# Patient Record
Sex: Female | Born: 1955 | Race: Black or African American | Hispanic: No | Marital: Single
Health system: Southern US, Community
[De-identification: ages and names within clinical notes are randomized; demographics above are authoritative.]

---

## 2010-12-09 ENCOUNTER — Inpatient Hospital Stay: Payer: Self-pay | Admitting: *Deleted

## 2011-01-18 ENCOUNTER — Emergency Department: Payer: Self-pay | Admitting: Emergency Medicine

## 2011-02-14 ENCOUNTER — Emergency Department: Payer: Self-pay | Admitting: Emergency Medicine

## 2012-06-21 ENCOUNTER — Emergency Department: Payer: Self-pay | Admitting: Emergency Medicine

## 2012-08-05 ENCOUNTER — Ambulatory Visit: Payer: Self-pay | Admitting: Emergency Medicine

## 2012-08-05 LAB — CBC WITH DIFFERENTIAL/PLATELET
Basophil #: 0 10*3/uL (ref 0.0–0.1)
Basophil %: 0.4 %
Eosinophil %: 0.8 %
Lymphocyte %: 26.7 %
MCHC: 33.6 g/dL (ref 32.0–36.0)
Monocyte #: 0.7 x10 3/mm (ref 0.2–0.9)
Monocyte %: 7 %
Neutrophil #: 6.3 10*3/uL (ref 1.4–6.5)
Neutrophil %: 65.1 %
Platelet: 194 10*3/uL (ref 150–440)
RBC: 4.43 10*6/uL (ref 3.80–5.20)
RDW: 14.2 % (ref 11.5–14.5)
WBC: 9.7 10*3/uL (ref 3.6–11.0)

## 2012-08-05 LAB — BASIC METABOLIC PANEL
Anion Gap: 9 (ref 7–16)
BUN: 16 mg/dL (ref 7–18)
Calcium, Total: 10.5 mg/dL — ABNORMAL HIGH (ref 8.5–10.1)
Chloride: 103 mmol/L (ref 98–107)
Co2: 25 mmol/L (ref 21–32)
Creatinine: 0.95 mg/dL (ref 0.60–1.30)
EGFR (African American): >60
EGFR (Non-African Amer.): >60
Glucose: 83 mg/dL (ref 65–99)
Osmolality: 274 (ref 275–301)
Potassium: 3.2 mmol/L — ABNORMAL LOW (ref 3.5–5.1)
Sodium: 137 mmol/L (ref 136–145)

## 2012-08-07 ENCOUNTER — Ambulatory Visit: Payer: Self-pay | Admitting: Emergency Medicine

## 2013-02-03 ENCOUNTER — Emergency Department: Payer: Self-pay | Admitting: Internal Medicine

## 2013-02-03 LAB — COMPREHENSIVE METABOLIC PANEL
Albumin: 3.2 g/dL — ABNORMAL LOW (ref 3.4–5.0)
Alkaline Phosphatase: 86 U/L (ref 50–136)
Chloride: 101 mmol/L (ref 98–107)
Co2: 25 mmol/L (ref 21–32)
Creatinine: 1.56 mg/dL — ABNORMAL HIGH (ref 0.60–1.30)
EGFR (African American): 42 — ABNORMAL LOW
EGFR (Non-African Amer.): 36 — ABNORMAL LOW
Osmolality: 273 (ref 275–301)
Potassium: 3.3 mmol/L — ABNORMAL LOW (ref 3.5–5.1)
SGPT (ALT): 25 U/L (ref 12–78)

## 2013-02-03 LAB — CBC
MCV: 91 fL (ref 80–100)
Platelet: 236 10*3/uL (ref 150–440)
RBC: 4.48 10*6/uL (ref 3.80–5.20)

## 2013-02-10 LAB — WOUND CULTURE

## 2013-03-15 ENCOUNTER — Emergency Department: Payer: Self-pay | Admitting: Emergency Medicine

## 2013-03-15 LAB — LIPASE, BLOOD: Lipase: 143 U/L (ref 73–393)

## 2013-03-15 LAB — TROPONIN I: Troponin-I: <0.02 ng/mL

## 2013-03-15 LAB — CBC WITH DIFFERENTIAL/PLATELET
Basophil #: 0.1 10*3/uL (ref 0.0–0.1)
HCT: 38.6 % (ref 35.0–47.0)
HGB: 13.3 g/dL (ref 12.0–16.0)
Lymphocyte #: 1.5 10*3/uL (ref 1.0–3.6)
MCH: 31 pg (ref 26.0–34.0)
Monocyte #: 0.4 x10 3/mm (ref 0.2–0.9)
Monocyte %: 3.5 %
Neutrophil #: 9.4 10*3/uL — ABNORMAL HIGH (ref 1.4–6.5)
Platelet: 269 10*3/uL (ref 150–440)
RDW: 15.9 % — ABNORMAL HIGH (ref 11.5–14.5)
WBC: 12 10*3/uL — ABNORMAL HIGH (ref 3.6–11.0)

## 2013-03-15 LAB — COMPREHENSIVE METABOLIC PANEL
Albumin: 3.5 g/dL (ref 3.4–5.0)
Alkaline Phosphatase: 91 U/L (ref 50–136)
Anion Gap: 8 (ref 7–16)
Bilirubin,Total: 1 mg/dL (ref 0.2–1.0)
Calcium, Total: 10.7 mg/dL — ABNORMAL HIGH (ref 8.5–10.1)
Creatinine: 0.9 mg/dL (ref 0.60–1.30)
EGFR (African American): >60
EGFR (Non-African Amer.): >60
Osmolality: 275 (ref 275–301)
Potassium: 3 mmol/L — ABNORMAL LOW (ref 3.5–5.1)
Sodium: 135 mmol/L — ABNORMAL LOW (ref 136–145)

## 2013-03-15 LAB — CK TOTAL AND CKMB (NOT AT ARMC): CK, Total: 59 U/L (ref 21–215)

## 2013-03-20 LAB — CULTURE, BLOOD (SINGLE)

## 2013-05-07 DEATH — deceased

## 2014-08-27 NOTE — Op Note (Signed)
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   PROCEDURE: Wide excision of this chronically infected wound, and frozen section. I was able to do a primary closure with flap closure.   DESCRIPTION OF PROCEDURE: After the patient was put to sleep attention was turned towards the upper back, left shoulder area. The patient had a chronic wound which was over 7 cm. There were thickened edges and excavating margins. The patient has a history of a similar kind of lesion before, which was  resected and it came back. This has been going on for awhile and it is chronically infected, and the patient complains of a lot of pain in it. The patient was also then brought to surgery. Under general anesthesia, the left part of the upper back was then prepped and draped. First of all I took a piece of skin and sent it to the pathologist, and then after that I went way beyond this lesion and marked it with a pencil and then excised all the thickened edges as well as thickened subcutaneous tissue, which goes way far away from the wound, and then the whole lesion was then completely removed. After that, the patient had a very large area to cover. I washed out very nicely, and then undermined the edges from the lower end and also the upper end, and then closed in such a way with 3-0 Vicryl so make we could make a space so we could close it, and finally we closed the wound with the 3-0 Vicryl first all around, and there was some tension in the middle of the wound, but otherwise it closed very nicely. After that, closed with 3-0 nylon interrupted sutures, and a dressing then was applied. The patient was then sent to recovery in a good condition.    ____________________________ Alton RevereMasud S. Cecelia ByarsHashmi,  MD [id number] D: 08/07/2012 09:01:00 ET T: 08/07/2012 09:17:15 ET JOB#: [id number]  cc: Serita ShellerErnest B. Maryellen PileEason, MD Rachael Ferrie S. Cecelia ByarsHashmi, MD, <Dictator> Meryle ReadyMASUD S Demetrias Goodbar MD ELECTRONICALLY SIGNED 08/14/2012 9:57

## 2015-01-15 IMAGING — CT CT CHEST-ABD-PELV W/ CM
3 of 11 series · 14 of 37 positions shown, 17 images · IV contrast (APPLIED)
Comparison: Chest radiograph dated 03/15/2013

CLINICAL DATA: Intermittent chest pain, recent left back surgery
with surgical drains, lung masses on chest radiograph, generalized
chest/abdominal pain

EXAM:
CT ANGIOGRAPHY CHEST
CT ABDOMEN AND PELVIS WITH CONTRAST
TECHNIQUE: Multidetector CT imaging of the chest was performed using the
standard protocol during bolus administration of intravenous
contrast. Multiplanar CT image reconstructions including MIPs were
obtained to evaluate the vascular anatomy. Multidetector CT imaging
of the abdomen and pelvis was performed using the standard protocol
during bolus administration of intravenous contrast.
CONTRAST:  125 mL Isovue 370 IV

[Series 7: routine abd pel with · axial · 0.71mm/px · z∈[-987,-842]mm · 2 of 89 slices shown, 5 images]
[im 30/89  mediastinal]
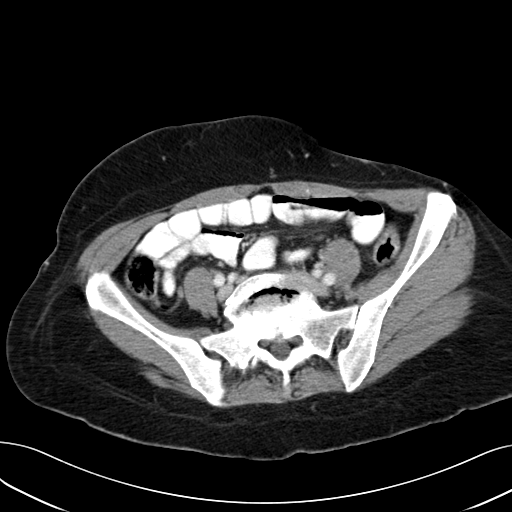
[im 30/89  lung]
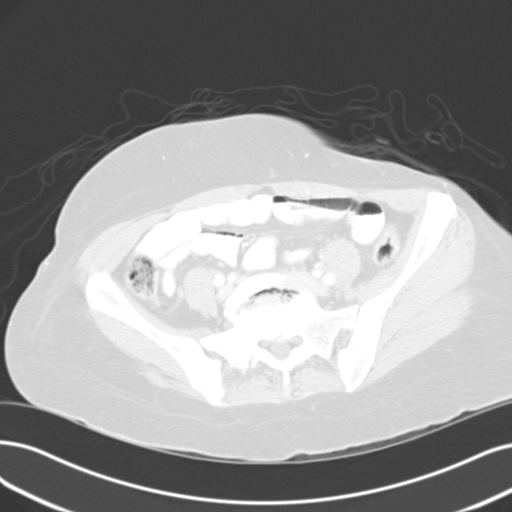
[im 30/89  bone]
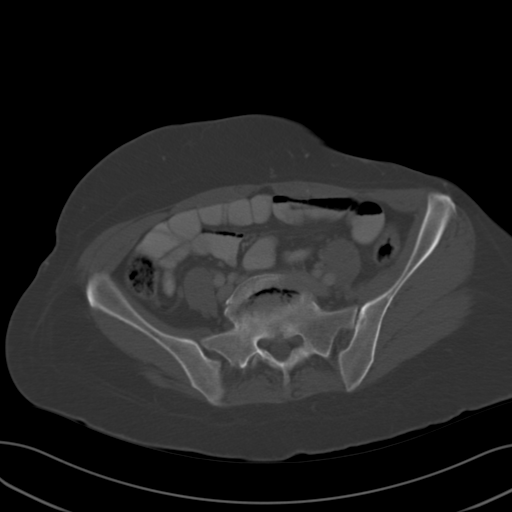
[im 59/89  mediastinal]
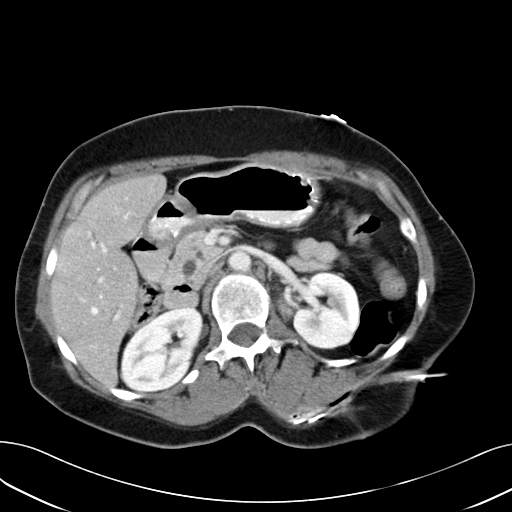
[im 59/89  lung]
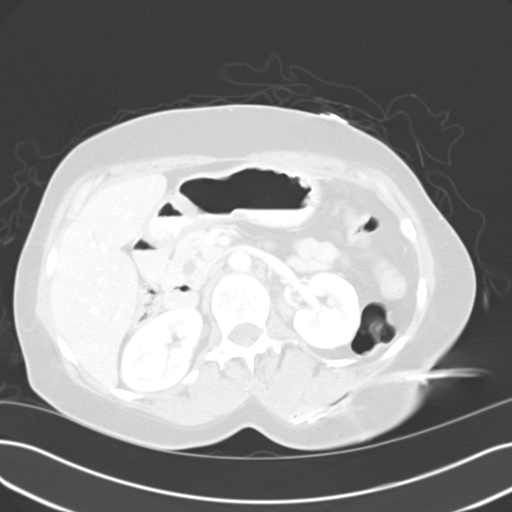

[Series 8: pe 2.0 · axial · 0.66mm/px · z∈[-770,-622]mm · 4 of 148 slices shown]
[im 25/148  mediastinal]
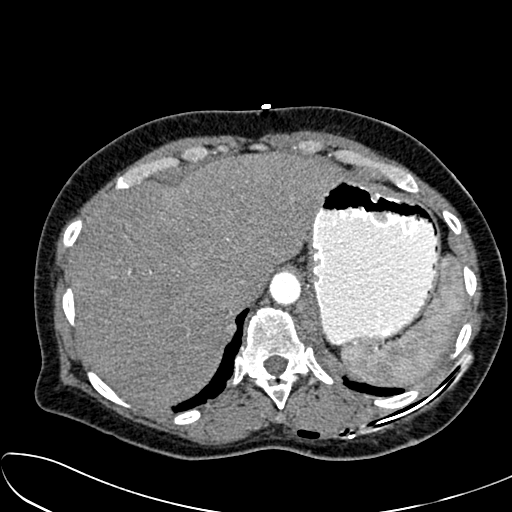
[im 50/148  mediastinal]
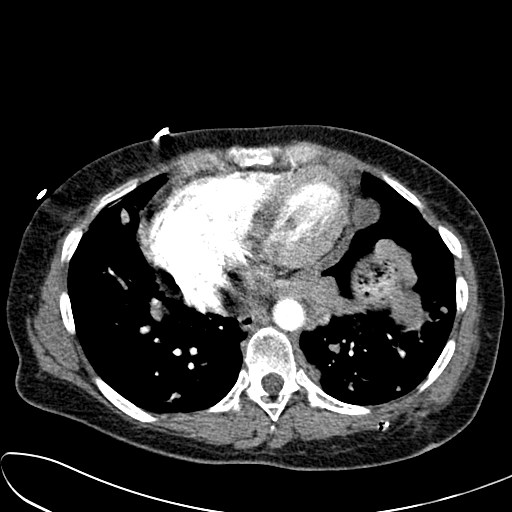
[im 74/148  mediastinal]
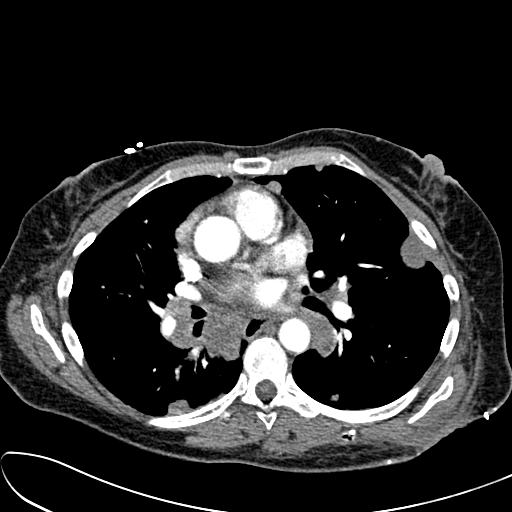
[im 99/148  mediastinal]
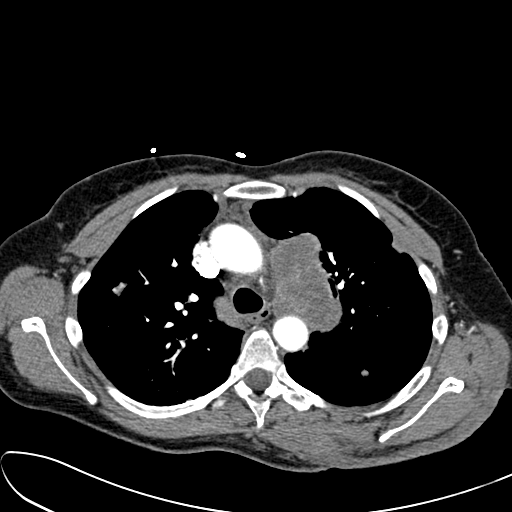

[Series 10: pe 1.0 thins · axial · 0.66mm/px · z∈[-796,-547]mm · 8 of 295 slices shown]
[im 23/295  mediastinal]
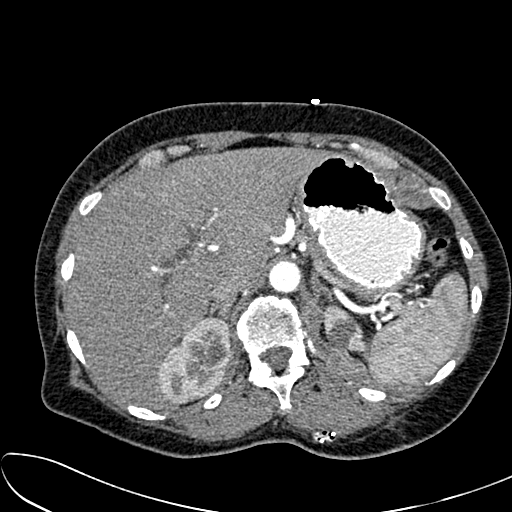
[im 68/295  mediastinal]
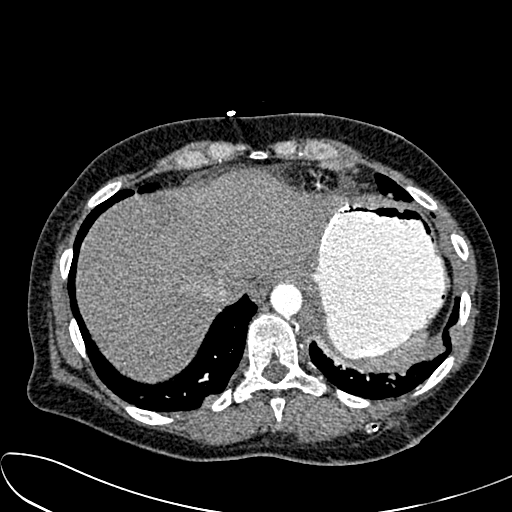
[im 91/295  mediastinal]
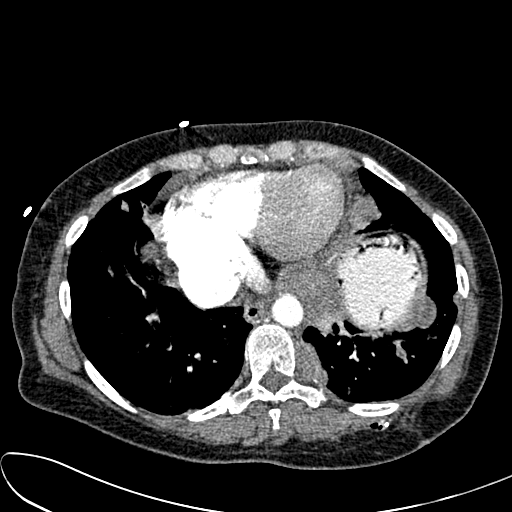
[im 136/295  mediastinal]
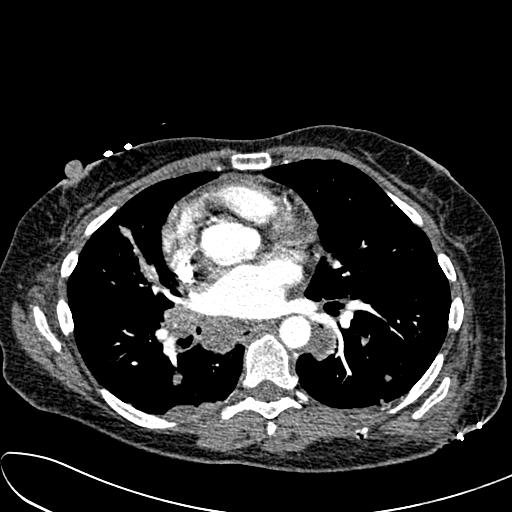
[im 159/295  mediastinal]
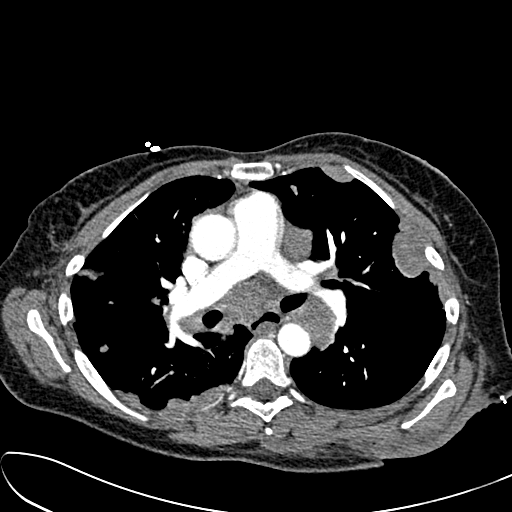
[im 204/295  mediastinal]
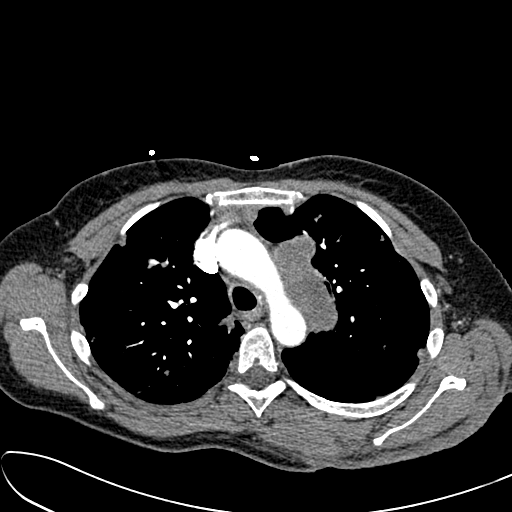
[im 227/295  mediastinal]
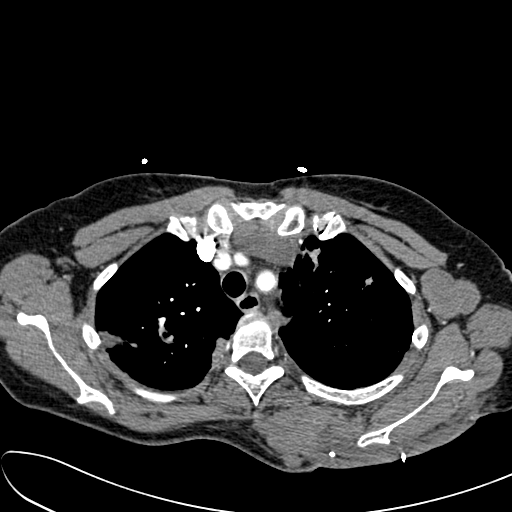
[im 272/295  mediastinal]
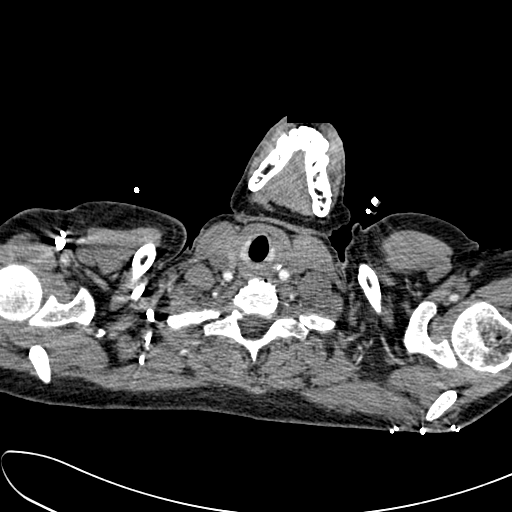

[14 of 37 positions shown; findings below may reference images not displayed]

FINDINGS: CTA CHEST FINDINGS

No evidence of pulmonary embolism.

Numerous parenchymal and pleural-based metastases throughout both
lungs. Representative lesions include:

--15 x 14 mm right upper lobe pulmonary nodule (series 11/image 57)

--1.7 x 5.1 cm pleural based mass overlying the right posterior 6th
rib (series 8/image 64)

--3.2 x 3.0 cm left chest wall/pleural-based mass in the left
6rd-3th rib interspace (series 8/image 65)

--12 x 10 mm right lower lobe nodule (series 11/image 79)

--7 x 7 mm left lower lobe pulmonary nodule (series 11/image 82)

Underlying mild to moderate paraseptal emphysematous changes.

Visualized thyroid is unremarkable.

The heart is normal in size. No pericardial effusion. Pericardial
metastases measuring up to 2.1 x 1.9 cm (series 8/ image 102).

Extensive thoracic lymphadenopathy, including:

--3.4 x 3.7 cm aggregate left axillary nodal mass (series 8/ image
35)

--6.5 x 4.4 cm AP window nodal mass (series 8/ image 55)

--2.7 cm short axis precarinal node (series 8/image 63)

--2.4 cm short axis left hilar node (series 8/ image 68)

--2.4 cm short axis medial right lower lobe node (series 8/image 36)

Postsurgical changes in the subcutaneous tissues of the left back
(series 8/ image 54). Overlying skin staples. Underlying surgical
clips and two surgical drains. Trace subcutaneous/intermuscular
postsurgical fluid (series 8/image 39), without drainable fluid
collection/abscess.

Three subcutaneous/intramuscular implants in the left paraspinal
back, measuring up to 1.3 x 1.4 cm (series 8/image 19).

Mild degenerative changes of the visualized thoracolumbar spine.

CT ABDOMEN and PELVIS FINDINGS

Pleural-based metastases along the left hemidiaphragm (series 9/
image 11).

Liver, spleen, and adrenal glands are within normal limits.

Status post cholecystectomy mild intrahepatic and extrahepatic
ductal dilatation. Common duct measures up to 10 mm and tapers
abruptly at the ampulla.

Pancreas is notable for mild dilatation of the main pancreatic duct,
measuring up to 4.5 mm. No definite obstructing mass is seen in the
pancreatic head/uncinate process.

Mildly complex/septated 1.5 x 1.8 cm medial right upper pole renal
cyst (series 9/ image 89). Two adjacent left upper pole renal cyst
measuring up to 1.4 x 1.5 cm (series 9/ image 12). No
hydronephrosis.

No evidence of bowel obstruction.  Normal appendix.

Atherosclerotic calcifications of the abdominal aorta and branch
vessels.

No abdominopelvic ascites.

No suspicious abdominopelvic lymphadenopathy.

Status post hysterectomy. No adnexal masses.

Bladder is within normal limits.

Degenerative changes at of lumbar spine, most prominent at L4-5 and
L5-S1.

Review of the MIP images confirms the above findings.
IMPRESSION: No evidence of pulmonary embolism.

Widespread pulmonary, pleural/chest wall, left back, and thoracic
nodal malignancy, as described above.

Postsurgical changes in the subcutaneous left back. Two surgical
drains. No residual drainable fluid collection/abscess.

Additional ancillary findings as above.
